# Patient Record
Sex: Female | Born: 1950 | Race: White | Hispanic: No | Marital: Single | Smoking: Never smoker
Health system: Southern US, Community
[De-identification: ages and names within clinical notes are randomized; demographics above are authoritative.]

## PROBLEM LIST (undated history)

## (undated) DIAGNOSIS — J309 Allergic rhinitis, unspecified: Secondary | ICD-10-CM

## (undated) DIAGNOSIS — G4730 Sleep apnea, unspecified: Secondary | ICD-10-CM

## (undated) HISTORY — PX: ABDOMINAL HYSTERECTOMY: SHX81

## (undated) HISTORY — DX: Allergic rhinitis, unspecified: J30.9

---

## 2008-01-31 DIAGNOSIS — E669 Obesity, unspecified: Secondary | ICD-10-CM | POA: Insufficient documentation

## 2008-01-31 DIAGNOSIS — L309 Dermatitis, unspecified: Secondary | ICD-10-CM | POA: Insufficient documentation

## 2008-01-31 DIAGNOSIS — J45909 Unspecified asthma, uncomplicated: Secondary | ICD-10-CM | POA: Insufficient documentation

## 2011-01-26 DIAGNOSIS — G4733 Obstructive sleep apnea (adult) (pediatric): Secondary | ICD-10-CM | POA: Insufficient documentation

## 2011-05-04 DIAGNOSIS — H5210 Myopia, unspecified eye: Secondary | ICD-10-CM | POA: Insufficient documentation

## 2011-05-04 DIAGNOSIS — H04129 Dry eye syndrome of unspecified lacrimal gland: Secondary | ICD-10-CM | POA: Insufficient documentation

## 2012-06-28 DIAGNOSIS — K760 Fatty (change of) liver, not elsewhere classified: Secondary | ICD-10-CM | POA: Insufficient documentation

## 2012-08-01 DIAGNOSIS — E7800 Pure hypercholesterolemia, unspecified: Secondary | ICD-10-CM | POA: Insufficient documentation

## 2014-06-04 DIAGNOSIS — F4320 Adjustment disorder, unspecified: Secondary | ICD-10-CM | POA: Insufficient documentation

## 2014-06-04 DIAGNOSIS — L821 Other seborrheic keratosis: Secondary | ICD-10-CM | POA: Insufficient documentation

## 2015-04-18 DIAGNOSIS — R7303 Prediabetes: Secondary | ICD-10-CM | POA: Insufficient documentation

## 2017-02-16 HISTORY — PX: RHINOPLASTY: SUR1284

## 2017-04-14 DIAGNOSIS — J3489 Other specified disorders of nose and nasal sinuses: Secondary | ICD-10-CM | POA: Insufficient documentation

## 2017-06-17 DIAGNOSIS — G8929 Other chronic pain: Secondary | ICD-10-CM | POA: Insufficient documentation

## 2017-08-18 DIAGNOSIS — R262 Difficulty in walking, not elsewhere classified: Secondary | ICD-10-CM | POA: Insufficient documentation

## 2017-08-18 DIAGNOSIS — W19XXXA Unspecified fall, initial encounter: Secondary | ICD-10-CM | POA: Insufficient documentation

## 2017-11-19 DIAGNOSIS — Z01818 Encounter for other preprocedural examination: Secondary | ICD-10-CM | POA: Insufficient documentation

## 2020-09-20 ENCOUNTER — Other Ambulatory Visit: Payer: Self-pay

## 2020-09-20 ENCOUNTER — Other Ambulatory Visit: Payer: Self-pay | Admitting: Family Medicine

## 2020-09-20 ENCOUNTER — Ambulatory Visit
Admission: RE | Admit: 2020-09-20 | Discharge: 2020-09-20 | Disposition: A | Discharge status: Home / Self Care | Payer: 59 | Attending: Family Medicine | Admitting: Family Medicine

## 2020-09-20 ENCOUNTER — Ambulatory Visit
Admission: RE | Admit: 2020-09-20 | Discharge: 2020-09-20 | Disposition: A | Discharge status: Home / Self Care | Payer: 59 | Source: Ambulatory Visit | Attending: Family Medicine | Admitting: Family Medicine

## 2020-09-20 DIAGNOSIS — M79672 Pain in left foot: Secondary | ICD-10-CM | POA: Insufficient documentation

## 2021-09-22 ENCOUNTER — Other Ambulatory Visit: Payer: Self-pay

## 2021-09-22 ENCOUNTER — Telehealth: Payer: Self-pay

## 2021-09-22 ENCOUNTER — Telehealth: Payer: Self-pay | Admitting: Gastroenterology

## 2021-09-22 DIAGNOSIS — Z1211 Encounter for screening for malignant neoplasm of colon: Secondary | ICD-10-CM

## 2021-09-22 MED ORDER — NA SULFATE-K SULFATE-MG SULF 17.5-3.13-1.6 GM/177ML PO SOLN: 1.0000 | Freq: Once | ORAL | 0 refills | Status: AC

## 2021-09-22 NOTE — Telephone Encounter (Signed)
Patient calling to schedule colonoscopy. Please return patients call.  

## 2021-09-22 NOTE — Telephone Encounter (Signed)
Gastroenterology Pre-Procedure Review  Request Date: 11/04/21 Requesting Physician: Dr. Servando Snare  PATIENT REVIEW QUESTIONS: The patient responded to the following health history questions as indicated:    Pt does not want anesthesia  1. Are you having any GI issues? no 2. Do you have a personal history of Polyps? no 3. Do you have a family history of Colon Cancer or Polyps? no 4. Diabetes Mellitus? no 5. Joint replacements in the past 12 months?no 6. Major health problems in the past 3 months?no 7. Any artificial heart valves, MVP, or defibrillator?no    MEDICATIONS & ALLERGIES:    Patient reports the following regarding taking any anticoagulation/antiplatelet therapy:   Plavix, Coumadin, Eliquis, Xarelto, Lovenox, Pradaxa, Brilinta, or Effient? no Aspirin? no  Patient confirms/reports the following medications:  No current outpatient medications on file.   No current facility-administered medications for this visit.    Patient confirms/reports the following allergies:  Allergies  Allergen Reactions   Codeine Shortness Of Breath   Ibuprofen Other (See Comments)    occasional wheezing pt reports but is able to take it occasional wheezing pt reports but is able to take it     No orders of the defined types were placed in this encounter.   AUTHORIZATION INFORMATION Primary Insurance: 1D#: Group #:  Secondary Insurance: 1D#: Group #:  SCHEDULE INFORMATION: Date: 11/04/21 Time: Location: [facility]

## 2021-09-30 ENCOUNTER — Telehealth: Payer: Self-pay | Admitting: Gastroenterology

## 2021-09-30 NOTE — Telephone Encounter (Signed)
Patient has questions about colonoscopy anesthesia, wants colonoscopy done with water irrigation instead of general anesthesia. Requests to speak directly to Dr Servando Snare himself.

## 2021-10-28 ENCOUNTER — Telehealth: Payer: Self-pay

## 2021-10-28 DIAGNOSIS — Z1211 Encounter for screening for malignant neoplasm of colon: Secondary | ICD-10-CM

## 2021-10-28 NOTE — Telephone Encounter (Signed)
Patients call has been returned to reschedule her  colonoscopy.  Colonoscopy has been rescheduled due to COVID.  Her new date is 12/09/21 still w/Dr. Servando Snare at [facility].  Trish in Endo has been notfied.  Thanks, [place], [place]

## 2021-12-04 NOTE — Telephone Encounter (Signed)
Left message on voicemail.

## 2021-12-09 ENCOUNTER — Ambulatory Visit
Admission: RE | Admit: 2021-12-09 | Discharge: 2021-12-09 | Disposition: A | Discharge status: Home / Self Care | Payer: 59 | Attending: Gastroenterology | Admitting: Gastroenterology

## 2021-12-09 ENCOUNTER — Encounter: Payer: Self-pay | Admitting: Certified Registered"

## 2021-12-09 ENCOUNTER — Other Ambulatory Visit: Payer: Self-pay

## 2021-12-09 ENCOUNTER — Encounter: Payer: Self-pay | Admitting: Gastroenterology

## 2021-12-09 ENCOUNTER — Encounter
Admission: RE | Disposition: A | Discharge status: Home / Self Care | Payer: Self-pay | Source: Home / Self Care | Attending: Gastroenterology

## 2021-12-09 DIAGNOSIS — Z1211 Encounter for screening for malignant neoplasm of colon: Secondary | ICD-10-CM

## 2021-12-09 DIAGNOSIS — K640 First degree hemorrhoids: Secondary | ICD-10-CM | POA: Insufficient documentation

## 2021-12-09 DIAGNOSIS — K5730 Diverticulosis of large intestine without perforation or abscess without bleeding: Secondary | ICD-10-CM | POA: Insufficient documentation

## 2021-12-09 HISTORY — DX: Sleep apnea, unspecified: G47.30

## 2021-12-09 HISTORY — PX: COLONOSCOPY WITH PROPOFOL: SHX5780

## 2021-12-09 SURGERY — COLONOSCOPY WITH PROPOFOL

## 2021-12-09 MED ORDER — SODIUM CHLORIDE 0.9 % IV SOLN: INTRAVENOUS | Status: DC

## 2021-12-09 MED ORDER — STERILE WATER FOR IRRIGATION IR SOLN
Status: DC | PRN
  Administered 2021-12-09: 180 mL

## 2021-12-09 NOTE — Op Note (Signed)
[facility] Gastroenterology Patient Name: Lori Cuevas Procedure Date: 12/09/2021 10:42 AM MRN: [id number] Account #: [id number] Date of Birth: 03-Aug-1950 Admit Type: Outpatient Age: 71 Room: [facility] ENDO ROOM 4 Gender: Female Note Status: Finalized Instrument Name: Nelda Marseille [id number] Procedure:             Colonoscopy Indications:           Screening for colorectal malignant neoplasm Providers:             Midge Minium MD, MD Referring MD:          Presley Raddle Revelo (Referring MD) Medicines:             None Complications:         No immediate complications. Procedure:             Pre-Anesthesia Assessment:                        - Prior to the procedure, a History and Physical was                         performed, and patient medications and allergies were                         reviewed. The patient's tolerance of previous                         anesthesia was also reviewed. The risks and benefits                         of the procedure and the sedation options and risks                         were discussed with the patient. All questions were                         answered, and informed consent was obtained. Prior                         Anticoagulants: The patient has taken no previous                         anticoagulant or antiplatelet agents. ASA Grade                         Assessment: II - A patient with mild systemic disease.                         After reviewing the risks and benefits, the patient                         was deemed in satisfactory condition to undergo the                         procedure.                        After obtaining informed consent, the colonoscope was  passed under direct vision. Throughout the procedure,                         the patient's blood pressure, pulse, and oxygen                         saturations were monitored continuously. The                         Colonoscope  was introduced through the anus and                         advanced to the the cecum, identified by appendiceal                         orifice and ileocecal valve. The colonoscopy was                         performed without difficulty. The patient tolerated                         the procedure well. The quality of the bowel                         preparation was excellent. Findings:      The perianal and digital rectal examinations were normal.      Multiple small-mouthed diverticula were found in the entire colon.      Non-bleeding internal hemorrhoids were found during retroflexion. The       hemorrhoids were Grade I (internal hemorrhoids that do not prolapse). Impression:            - Diverticulosis in the entire examined colon.                        - Non-bleeding internal hemorrhoids.                        - No specimens collected. Recommendation:        - Discharge patient to home.                        - Resume previous diet.                        - Continue present medications. Procedure Code(s):     --- Professional ---                        [phone], Colonoscopy, flexible; diagnostic, including                         collection of specimen(s) by brushing or washing, when                         performed (separate procedure) Diagnosis Code(s):     --- Professional ---                        Z12.11, Encounter for screening for malignant neoplasm  of colon CPT copyright 2019 American Medical Association. All rights reserved. The codes documented in this report are preliminary and upon coder review may  be revised to meet current compliance requirements. Lucilla Lame MD, MD 12/09/2021 11:05:07 AM This report has been signed electronically. Number of Addenda: 0 Note Initiated On: 12/09/2021 10:42 AM Scope Withdrawal Time: 0 hours 10 minutes 26 seconds  Total Procedure Duration: 0 hours 17 minutes 18 seconds  Estimated Blood Loss:  Estimated blood  loss: none.      [facility]

## 2021-12-09 NOTE — H&P (Signed)
   Lucilla Lame, MD [address] [place], [place] [address] Phone: [phone] Fax : [phone]  Primary Care Physician:  Theotis Burrow, MD Primary Gastroenterologist:  Dr. Allen Norris  Pre-Procedure History & Physical: HPI:  Lori Cuevas is a 71 y.o. female is here for a screening colonoscopy.   Past Medical History:  Diagnosis Date   Sleep apnea     Past Surgical History:  Procedure Laterality Date   ABDOMINAL HYSTERECTOMY      Prior to Admission medications   Medication Sig Start Date End Date Taking? Authorizing Provider  aspirin EC 81 MG tablet Take by mouth. 02/23/11  Yes [provider]  Cholecalciferol (VITAMIN D-1000 MAX ST) 25 MCG (1000 UT) tablet Take by mouth. 02/23/11  Yes [provider]  Multiple Vitamin (MULTI-VITAMIN) tablet Take 1 tablet by mouth daily.   Yes [provider]  albuterol (PROVENTIL) (2.5 MG/3ML) 0.083% nebulizer solution Inhale into the lungs. Patient not taking: Reported on 12/09/2021    [provider]  fluticasone Asencion Islam) 50 MCG/ACT nasal spray Frequency:QD   Dosage:0.0     Instructions:  Note:Dose: 1 01/26/11   [provider]    Allergies as of 09/22/2021 - Review Complete 09/22/2021  Allergen Reaction Noted   Codeine Shortness Of Breath 03/11/2016   Ibuprofen Other (See Comments) 11/19/2017    History reviewed. No pertinent family history.  Social History   Socioeconomic History   Marital status: Single    Spouse name: Not on file   Number of children: Not on file   Years of education: Not on file   Highest education level: Not on file  Occupational History   Not on file  Tobacco Use   Smoking status: Never   Smokeless tobacco: Never  Vaping Use   Vaping Use: Never used  Substance and Sexual Activity   Alcohol use: Never   Drug use: Never   Sexual activity: Not on file  Other Topics Concern   Not on file  Social History Narrative   Not on file    Social Determinants of Health   Financial Resource Strain: Not on file  Food Insecurity: Not on file  Transportation Needs: Not on file  Physical Activity: Not on file  Stress: Not on file  Social Connections: Not on file  Intimate Partner Violence: Not on file    Review of Systems: See HPI, otherwise negative ROS  Physical Exam: BP (!) 152/108   Pulse 91   Temp (!) 96.9 F (36.1 C) (Temporal)   Resp 18   Ht 5\' 4"  (1.626 m)   Wt 111.1 kg   SpO2 98%   BMI 42.05 kg/m  General:   Alert,  pleasant and cooperative in NAD Head:  Normocephalic and atraumatic. Neck:  Supple; no masses or thyromegaly. Lungs:  Clear throughout to auscultation.    Heart:  Regular rate and rhythm. Abdomen:  Soft, nontender and nondistended. Normal bowel sounds, without guarding, and without rebound.   Neurologic:  Alert and  oriented x4;  grossly normal neurologically.  Impression/Plan: [name] is now here to undergo a screening colonoscopy.  Risks, benefits, and alternatives regarding colonoscopy have been reviewed with the patient.  Questions have been answered.  All parties agreeable.

## 2021-12-10 ENCOUNTER — Encounter: Payer: Self-pay | Admitting: Gastroenterology

## 2022-01-09 IMAGING — CR DG FOOT COMPLETE 3+V*L*
1 series · 3 of 3 positions shown · non-contrast
Comparison: None.

CLINICAL DATA: Left foot pain after trauma.

EXAM:
LEFT FOOT - COMPLETE 3+ VIEW

[Series 1: dg foot complete left · 0.14mm/px · 3 of 3 slices shown]
[im 1/3]
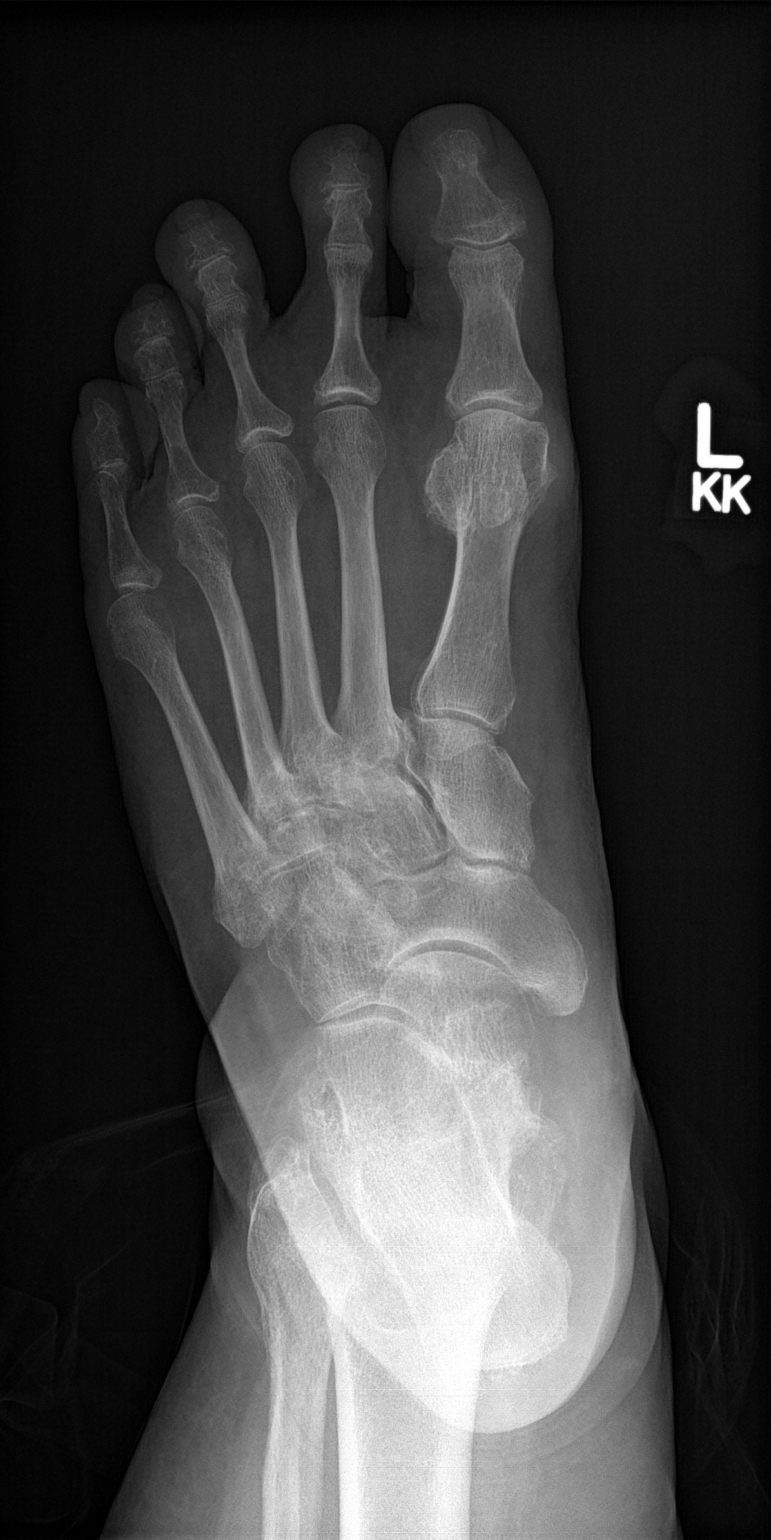
[im 2/3]
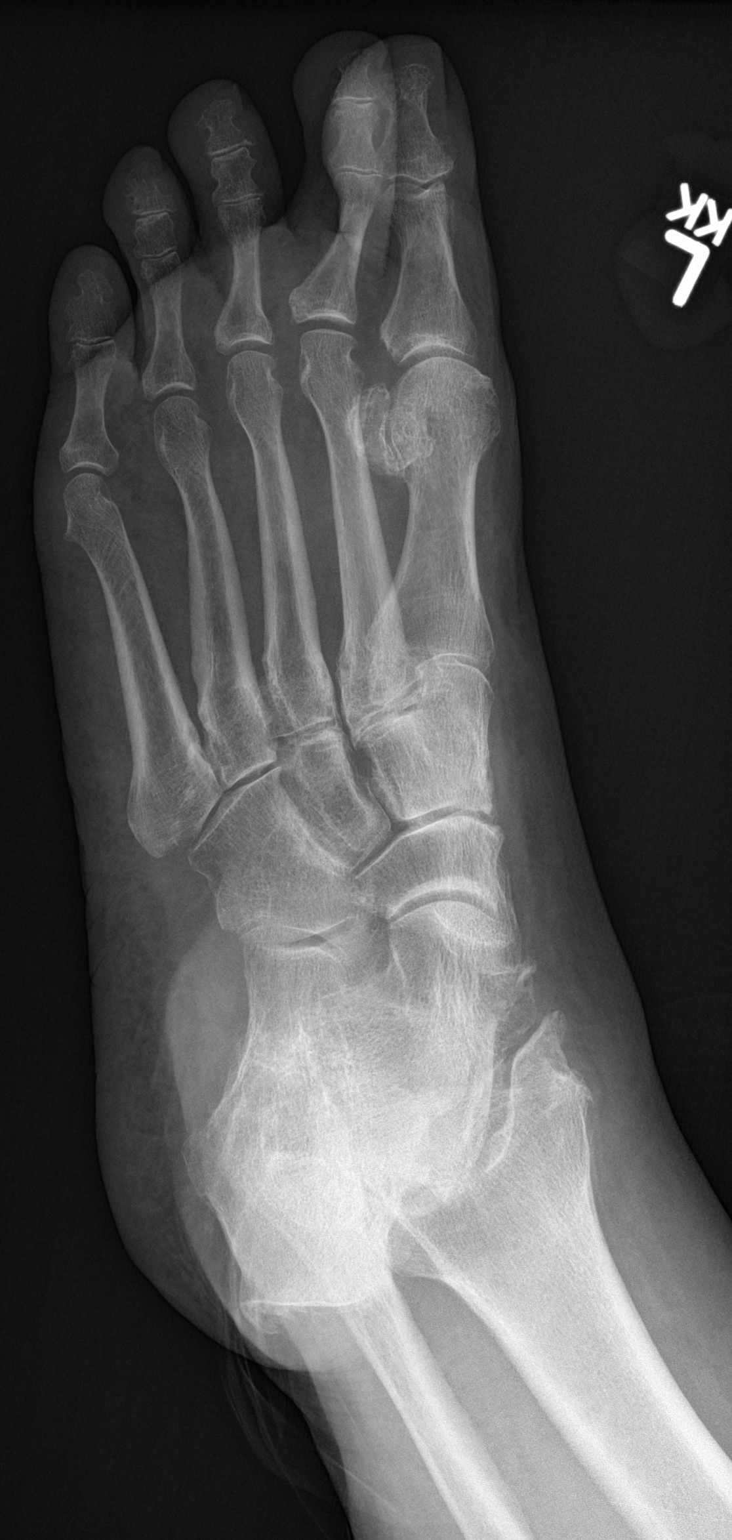
[im 3/3]
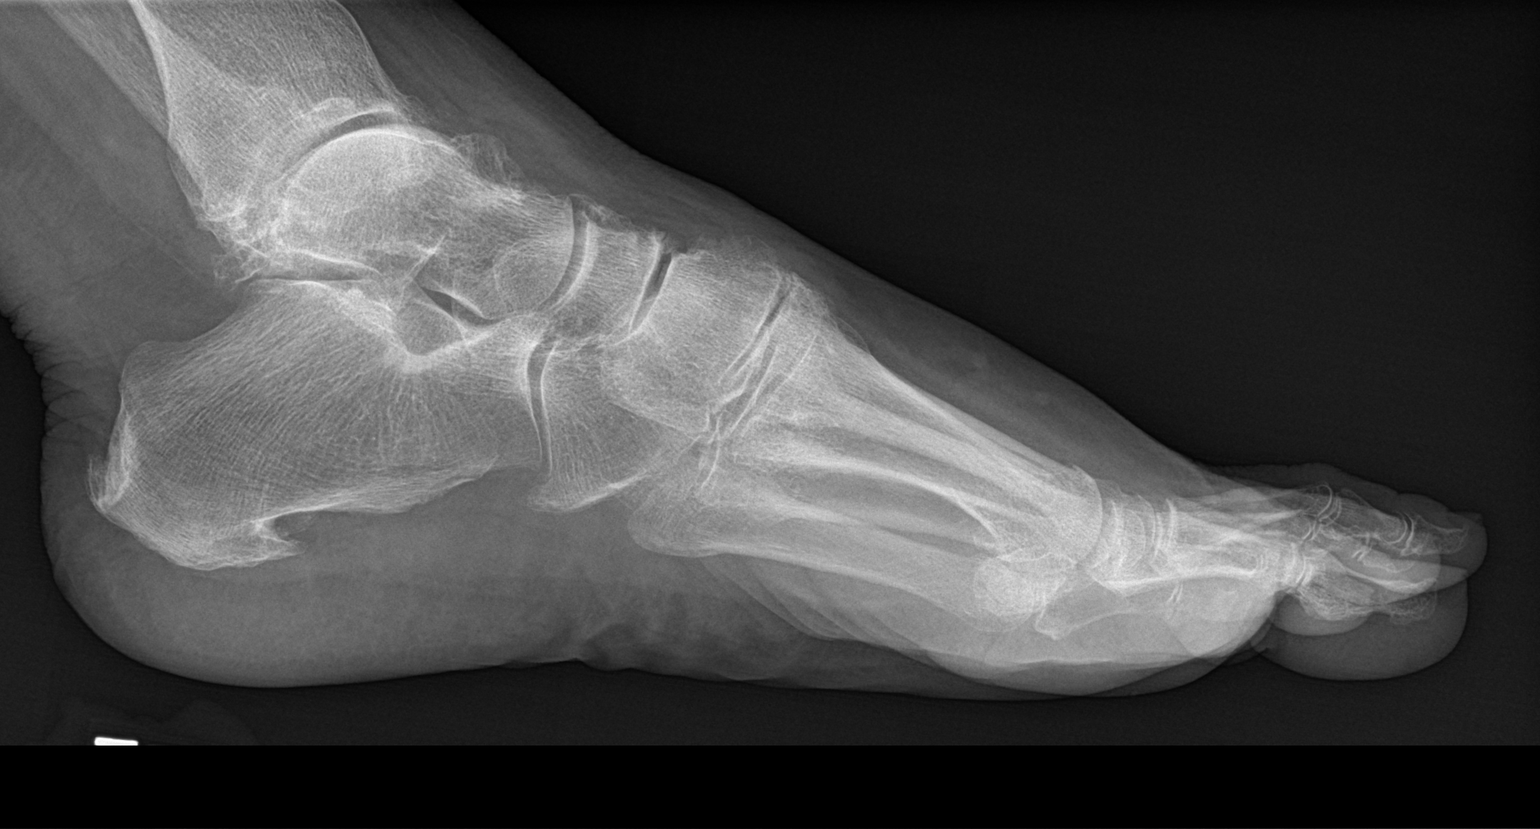

[3 of 3 positions shown; findings below may reference images not displayed]

FINDINGS: There is no evidence of fracture or dislocation. Mild degenerative
changes are seen in the dorsal midfoot. Plantar and posterior
calcaneal enthesophytes are noted. Soft tissues are unremarkable.
IMPRESSION: No acute osseous injury.  Mild degenerative changes.

## 2022-04-10 ENCOUNTER — Telehealth: Payer: Self-pay

## 2022-04-10 NOTE — Telephone Encounter (Signed)
I received a fax from PCP wanting to confirm whether pt will need a repeat colonoscopy, or will the recent procedure be her last... I did see no specimen was collected etc, just want to confirm  Please advise

## 2023-07-07 ENCOUNTER — Other Ambulatory Visit

## 2023-07-19 ENCOUNTER — Ambulatory Visit: Admit: 2023-07-19 | Admitting: Orthopedic Surgery

## 2023-07-19 SURGERY — ARTHROPLASTY, KNEE, TOTAL, USING IMAGELESS COMPUTER-ASSISTED NAVIGATION
Anesthesia: Choice | Site: Knee | Laterality: Right

## 2023-08-26 ENCOUNTER — Encounter: Payer: Self-pay | Admitting: Sleep Medicine

## 2023-08-26 ENCOUNTER — Ambulatory Visit (INDEPENDENT_AMBULATORY_CARE_PROVIDER_SITE_OTHER): Admitting: Sleep Medicine

## 2023-08-26 VITALS — BP 104/60 | HR 102 | Temp 100.0°F | Ht 62.0 in | Wt 206.4 lb

## 2023-08-26 DIAGNOSIS — G4733 Obstructive sleep apnea (adult) (pediatric): Secondary | ICD-10-CM

## 2023-08-26 DIAGNOSIS — E669 Obesity, unspecified: Secondary | ICD-10-CM

## 2023-08-26 DIAGNOSIS — Z6837 Body mass index (BMI) 37.0-37.9, adult: Secondary | ICD-10-CM

## 2023-08-26 DIAGNOSIS — E66812 Obesity, class 2: Secondary | ICD-10-CM

## 2023-08-26 NOTE — Patient Instructions (Addendum)

## 2023-08-26 NOTE — Progress Notes (Signed)
 Name:Lori Cuevas MRN: [id number] DOB: 08-Jul-1950   CHIEF COMPLAINT:  REASSESSMENT OF OSA   HISTORY OF PRESENT ILLNESS:  Lori Cuevas is a 73 y.o. w/ a h/o OSA, obesity, anxiety, depression and obesity who presents to establish care for OSA. Reports that she was initially diagnosed with OSA in 2007 and subsequently started on CPAP therapy. Reports difficulty using CPAP therapy due to mask discomfort and bloating. Patient does not have a regular bed partner, she is unsure of snoring. Denies excessive daytime sleepiness. Reports nocturnal awakenings due to unclear reasons, and has difficulty falling back to sleep. Reports a 40 lb weight loss over the last several months. Admits to night sweats and dry mouth. Denies morning headaches, RLS symptoms, dream enactment, cataplexy, hypnagogic or hypnapompic hallucinations. Reports a family history of sleep apnea. Denies drowsy driving. Drinks 1 cup of coffee daily, occasional alcohol use, denies tobacco or illicit drug use.   Bedtime 10-11 pm Sleep onset 10-15 mins Rise time 5-8 am   EPWORTH SLEEP SCORE 3     No data to display          PAST MEDICAL HISTORY :   has a past medical history of Allergic rhinitis and Sleep apnea.  has a past surgical history that includes Abdominal hysterectomy; Colonoscopy with propofol  (N/A, 12/09/2021); and Rhinoplasty (Left, 2019). Prior to Admission medications   Medication Sig Start Date End Date Taking? Authorizing Provider  acetaminophen (TYLENOL) 650 MG CR tablet Take 650 mg by mouth every 8 (eight) hours as needed for pain or fever.   Yes [provider]  albuterol (PROVENTIL) (2.5 MG/3ML) 0.083% nebulizer solution Inhale into the lungs.   Yes [provider]  aspirin EC 81 MG tablet Take by mouth. 02/23/11  Yes [provider]  Cholecalciferol (VITAMIN D-1000 MAX ST) 25 MCG (1000 UT) tablet Take by mouth. 02/23/11  Yes [provider]  cyclobenzaprine  (FLEXERIL) 10 MG tablet Take 10 mg by mouth at bedtime. 08/04/23  Yes [provider]  FLUoxetine (PROZAC) 10 MG capsule Take 10 mg by mouth daily. 08/04/23  Yes [provider]  fluticasone OREN) 50 MCG/ACT nasal spray Frequency:QD   Dosage:0.0     Instructions:  Note:Dose: 1 01/26/11  Yes [provider]  Multiple Vitamin (MULTI-VITAMIN) tablet Take 1 tablet by mouth daily.   Yes [provider]   Allergies  Allergen Reactions   Codeine Shortness Of Breath   Ibuprofen Other (See Comments)    occasional wheezing pt reports but is able to take it occasional wheezing pt reports but is able to take it     FAMILY HISTORY:  family history is not on file. SOCIAL HISTORY:  reports that she has never smoked. She has never used smokeless tobacco. She reports that she does not drink alcohol and does not use drugs.   Review of Systems:  Gen:  Denies  fever, sweats, chills weight loss  HEENT: Denies blurred vision, double vision, ear pain, eye pain, hearing loss, nose bleeds, sore throat Cardiac:  No dizziness, chest pain or heaviness, chest tightness,edema, No JVD Resp:   No cough, -sputum production, -shortness of breath,-wheezing, -hemoptysis,  Gi: Denies swallowing difficulty, stomach pain, nausea or vomiting, diarrhea, constipation, bowel incontinence Gu:  Denies bladder incontinence, burning urine Ext:   Denies Joint pain, stiffness or swelling Skin: Denies  skin rash, easy bruising or bleeding or hives Endoc:  Denies polyuria, polydipsia , polyphagia or weight change Psych:  Denies depression, insomnia or hallucinations  Other:  All other systems negative  VITAL SIGNS: BP 104/60 (BP Location: Left Arm, Patient Position: Sitting, Cuff Size: Large)   Pulse (!) 102   Temp 100 F (37.8 C) (Oral)   Ht 5' 2 (1.575 m)   Wt 206 lb 6.4 oz (93.6 kg)   SpO2 97%   BMI 37.75 kg/m    Physical Examination:   General Appearance: No distress  EYES  PERRLA, EOM intact.   NECK Supple, No JVD Pulmonary: normal breath sounds, No wheezing.  CardiovascularNormal S1,S2.  No m/r/g.   Abdomen: Benign, Soft, non-tender. Skin:   warm, no rashes, no ecchymosis  Extremities: normal, no cyanosis, clubbing. Neuro:without focal findings,  speech normal  PSYCHIATRIC: Mood, affect within normal limits.   ASSESSMENT AND PLAN  OSA Restarting patient on CPAP therapy. Will try patient on another face mask and change pressure setting. Discussed the consequences of untreated sleep apnea. Advised not to drive drowsy for safety of patient and others. Will follow up in 3 months.   Obesity Counseled patient on diet and lifestyle modification.    Patient  satisfied with Plan of action and management. All questions answered  I spent a total of 30 minutes reviewing chart data, face-to-face evaluation with the patient, counseling and coordination of care as detailed above.    Chauna Osoria, M.D.  Sleep Medicine [facility]

## 2023-10-11 ENCOUNTER — Emergency Department
Admission: EM | Admit: 2023-10-11 | Discharge: 2023-10-11 | Disposition: A | Discharge status: Home / Self Care | Attending: Emergency Medicine | Admitting: Emergency Medicine

## 2023-10-11 ENCOUNTER — Other Ambulatory Visit: Payer: Self-pay

## 2023-10-11 ENCOUNTER — Encounter: Payer: Self-pay | Admitting: *Deleted

## 2023-10-11 ENCOUNTER — Emergency Department

## 2023-10-11 DIAGNOSIS — S0990XA Unspecified injury of head, initial encounter: Secondary | ICD-10-CM | POA: Insufficient documentation

## 2023-10-11 DIAGNOSIS — Y9301 Activity, walking, marching and hiking: Secondary | ICD-10-CM | POA: Diagnosis not present

## 2023-10-11 DIAGNOSIS — W01198A Fall on same level from slipping, tripping and stumbling with subsequent striking against other object, initial encounter: Secondary | ICD-10-CM | POA: Insufficient documentation

## 2023-10-11 DIAGNOSIS — M542 Cervicalgia: Secondary | ICD-10-CM | POA: Diagnosis not present

## 2023-10-11 NOTE — ED Notes (Signed)
 Pt given DC instructions. Pt verbalized understanding of follow up care. Pt ambulatory from ED without difficulty. NAD noted at departure.

## 2023-10-11 NOTE — ED Triage Notes (Signed)
 Pt to triage via wheelchair.  Pt tripped and fell onto the grass.  No loc.  No vomiting.  Pt has redness to forehead.  Pt also has neck pain.   Pt alert

## 2023-10-11 NOTE — ED Provider Notes (Signed)
 [facility] Provider Note    Event Date/Time   First MD Initiated Contact with Patient 10/11/23 2256     (approximate)   History   Fall   HPI  Lori Cuevas is a 73 year old female presenting to the emergency department for evaluation after a fall.  Patient was walking in the grass when she did not fully lift her foot and fell forward and hit her head.  No preceding chest pain, shortness of breath, lightheadedness.  No LOC.  No nausea or vomiting.  Reports pain over her right forehead where she hit the ground as well as initial neck pain which is now largely improved.  No numbness, tingling, focal weakness.     Physical Exam   Triage Vital Signs: ED Triage Vitals  Encounter Vitals Group     BP 10/11/23 1928 (!) (P) 155/82     Girls Systolic BP Percentile --      Girls Diastolic BP Percentile --      Boys Systolic BP Percentile --      Boys Diastolic BP Percentile --      Pulse Rate 10/11/23 1928 (P) 77     Resp 10/11/23 1934 18     Temp 10/11/23 1928 (P) 98.6 F (37 C)     Temp Source 10/11/23 1928 (P) Oral     SpO2 10/11/23 1928 (P) 96 %     Weight 10/11/23 1932 198 lb (89.8 kg)     Height 10/11/23 1932 5' 2 (1.575 m)     Head Circumference --      Peak Flow --      Pain Score 10/11/23 1932 1     Pain Loc --      Pain Education --      Exclude from Growth Chart --     Most recent vital signs: Vitals:   10/11/23 1934 10/11/23 2057  BP:  132/85  Pulse:  65  Resp: 18 19  Temp:  98.9 F (37.2 C)  SpO2:  98%    Nursing notes and vital signs reviewed.  General: Adult female, laying in bed, awake interactive Head: Small area of swelling over the right forehead without open areas of skin, head otherwise atraumatic Neck: No focal midline tenderness Chest: Symmetric chest rise, no tenderness to palpation.  Cardiac: Regular rhythm and rate.  Respiratory: Lungs clear to auscultation Abdomen: Soft, nondistended. No tenderness to  palpation.  Pelvis: Stable in AP and lateral compression. No tenderness to palpation. MSK: No deformity to bilateral upper and lower extremity. Full range of motion to bilateral upper lower extremity. Neuro: Alert, oriented. GCS 15. 5 out of 5 strength in bilateral upper and lower extremities. Normal sensation to light touch in bilateral upper and lower extremity. Skin: No evidence of burns or lacerations. ED Results / Procedures / Treatments   Labs (all labs ordered are listed, but only abnormal results are displayed) Labs Reviewed - No data to display   EKG EKG independently reviewed and interpreted by myself demonstrates:    RADIOLOGY Imaging independently reviewed and interpreted by myself demonstrates:  CT head without acute bleed CT C-spine without acute fracture  Formal Radiology Read:  CT Head Wo Contrast Result Date: 10/11/2023 CLINICAL DATA:  Head trauma, minor (Age >= 65y); Facial trauma, blunt EXAM: CT HEAD WITHOUT CONTRAST CT CERVICAL SPINE WITHOUT CONTRAST TECHNIQUE: Multidetector CT imaging of the head and cervical spine was performed following the standard protocol without intravenous contrast. Multiplanar CT image reconstructions of  the cervical spine were also generated. RADIATION DOSE REDUCTION: This exam was performed according to the departmental dose-optimization program which includes automated exposure control, adjustment of the mA and/or kV according to patient size and/or use of iterative reconstruction technique. COMPARISON:  None Available. FINDINGS: CT HEAD FINDINGS Brain: No evidence of large-territorial acute infarction. No parenchymal hemorrhage. No mass lesion. No extra-axial collection. No mass effect or midline shift. No hydrocephalus. Basilar cisterns are patent. Vascular: No hyperdense vessel. Skull: No acute fracture or focal lesion. Sinuses/Orbits: Bilateral maxillary sinus mucosal thickening. Otherwise paranasal sinuses and mastoid air cells are clear.  The orbits are unremarkable. Other: None. CT CERVICAL SPINE FINDINGS Alignment: Normal. Skull base and vertebrae: Multilevel moderate to severe degenerative changes of the spine. Associated severe osseous neural foraminal stenosis bilaterally at the C5-C6 level. No severe osseous central canal stenosis. No acute fracture. No aggressive appearing focal osseous lesion or focal pathologic process. Soft tissues and spinal canal: No prevertebral fluid or swelling. No visible canal hematoma. Upper chest: Unremarkable. Other: Atherosclerotic plaque of the aortic arch and its main branches. IMPRESSION: 1. No acute intracranial abnormality. 2. No acute displaced fracture or traumatic listhesis of the cervical spine. 3. 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 Electronically Signed   By: Morgane  Naveau M.D.   On: 10/11/2023 20:37   CT Cervical Spine Wo Contrast Result Date: 10/11/2023 CLINICAL DATA:  Head trauma, minor (Age >= 65y); Facial trauma, blunt EXAM: CT HEAD WITHOUT CONTRAST CT CERVICAL SPINE WITHOUT CONTRAST TECHNIQUE: Multidetector CT imaging of the head and cervical spine was performed following the standard protocol without intravenous contrast. Multiplanar CT image reconstructions of the cervical spine were also generated. RADIATION DOSE REDUCTION: This exam was performed according to the departmental dose-optimization program which includes automated exposure control, adjustment of the mA and/or kV according to patient size and/or use of iterative reconstruction technique. COMPARISON:  None Available. FINDINGS: CT HEAD FINDINGS Brain: No evidence of large-territorial acute infarction. No parenchymal hemorrhage. No mass lesion. No extra-axial collection. No mass effect or midline shift. No hydrocephalus. Basilar cisterns are patent. Vascular: No hyperdense vessel. Skull: No acute fracture or focal lesion. Sinuses/Orbits: Bilateral maxillary sinus mucosal thickening. Otherwise  paranasal sinuses and mastoid air cells are clear. The orbits are unremarkable. Other: None. CT CERVICAL SPINE FINDINGS Alignment: Normal. Skull base and vertebrae: Multilevel moderate to severe degenerative changes of the spine. Associated severe osseous neural foraminal stenosis bilaterally at the C5-C6 level. No severe osseous central canal stenosis. No acute fracture. No aggressive appearing focal osseous lesion or focal pathologic process. Soft tissues and spinal canal: No prevertebral fluid or swelling. No visible canal hematoma. Upper chest: Unremarkable. Other: Atherosclerotic plaque of the aortic arch and its main branches. IMPRESSION: 1. No acute intracranial abnormality. 2. No acute displaced fracture or traumatic listhesis of the cervical spine. 3. 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 Electronically Signed   By: Morgane  Naveau M.D.   On: 10/11/2023 20:37    PROCEDURES:  Critical Care performed: No  Procedures   MEDICATIONS ORDERED IN ED: Medications - No data to display   IMPRESSION / MDM / ASSESSMENT AND PLAN / ED COURSE  I reviewed the triage vital signs and the nursing notes.  Differential diagnosis includes, but is not limited to: Intracranial bleed, skull fracture, spine fracture, no evidence of thoracoabdominal trauma  Patient's presentation is most consistent with acute presentation with potential threat to life or bodily function.  Patient presents  after mechanical fall with head trauma. CT head and C-spine without acute traumatic injury. No new complaints on reeevaluation. Do think patient is stable for discharge. Strict return precautions provided.      FINAL CLINICAL IMPRESSION(S) / ED DIAGNOSES   Final diagnoses:  Closed head injury, initial encounter  Neck pain     Rx / DC Orders   ED Discharge Orders     None        Note:  This document was prepared using Dragon voice recognition software and may include  unintentional dictation errors.   Levander Slate, MD 10/11/23 [phone]

## 2023-10-11 NOTE — Discharge Instructions (Signed)
 You were seen in the emergency department today after a head injury. Fortunately, your exam and CT scan were overall reassuring. It is still possible that you have a concussion. I have included more information about this in your paperwork. Please follow-up with your primary care doctor within a few days for reevaluation. Return to the ER for any worsening symptoms including worsening headache, confusion, or any other new or concerning symptoms.

## 2023-11-30 ENCOUNTER — Ambulatory Visit: Admitting: Sleep Medicine
